# Patient Record
Sex: Male | Born: 1985 | Race: Black or African American | Hispanic: No | Marital: Married
Health system: Southern US, Community
[De-identification: ages and names within clinical notes are randomized; demographics above are authoritative.]

---

## 2014-01-03 ENCOUNTER — Ambulatory Visit: Payer: Self-pay | Admitting: Physician Assistant

## 2014-05-13 ENCOUNTER — Ambulatory Visit: Payer: Self-pay | Admitting: Family Medicine

## 2014-05-13 LAB — URINALYSIS, COMPLETE
Bilirubin,UR: NEGATIVE
Blood: NEGATIVE
Glucose,UR: NEGATIVE
Ketone: NEGATIVE
Leukocyte Esterase: NEGATIVE
Nitrite: NEGATIVE
Ph: 7.5 (ref 5.0–8.0)
Specific Gravity: 1.02 (ref 1.000–1.030)

## 2014-05-13 LAB — CBC WITH DIFFERENTIAL/PLATELET
Basophil #: 0 10*3/uL (ref 0.0–0.1)
Basophil %: 0.1 %
Eosinophil #: 0 10*3/uL (ref 0.0–0.7)
Eosinophil %: 0.1 %
HCT: 51.4 % (ref 40.0–52.0)
HGB: 17 g/dL (ref 13.0–18.0)
LYMPHS ABS: 0.4 10*3/uL — AB (ref 1.0–3.6)
Lymphocyte %: 2.2 %
MCH: 29.5 pg (ref 26.0–34.0)
MCHC: 33 g/dL (ref 32.0–36.0)
MCV: 90 fL (ref 80–100)
MONO ABS: 1.1 x10 3/mm — AB (ref 0.2–1.0)
Monocyte %: 6.9 %
NEUTROS ABS: 14.7 10*3/uL — AB (ref 1.4–6.5)
NEUTROS PCT: 90.7 %
Platelet: 212 10*3/uL (ref 150–440)
RBC: 5.74 10*6/uL (ref 4.40–5.90)
RDW: 12.8 % (ref 11.5–14.5)
WBC: 16.3 10*3/uL — AB (ref 3.8–10.6)

## 2014-05-14 ENCOUNTER — Ambulatory Visit: Payer: Self-pay

## 2014-05-14 LAB — CBC WITH DIFFERENTIAL/PLATELET
BASOS ABS: 0 10*3/uL (ref 0.0–0.1)
Basophil %: 0.5 %
Eosinophil #: 0 10*3/uL (ref 0.0–0.7)
Eosinophil %: 0.2 %
HCT: 48.9 % (ref 40.0–52.0)
HGB: 16.3 g/dL (ref 13.0–18.0)
LYMPHS PCT: 14 %
Lymphocyte #: 1.3 10*3/uL (ref 1.0–3.6)
MCH: 29.6 pg (ref 26.0–34.0)
MCHC: 33.3 g/dL (ref 32.0–36.0)
MCV: 89 fL (ref 80–100)
Monocyte #: 1.1 x10 3/mm — ABNORMAL HIGH (ref 0.2–1.0)
Monocyte %: 12.2 %
Neutrophil #: 6.7 10*3/uL — ABNORMAL HIGH (ref 1.4–6.5)
Neutrophil %: 73.1 %
PLATELETS: 199 10*3/uL (ref 150–440)
RBC: 5.5 10*6/uL (ref 4.40–5.90)
RDW: 13 % (ref 11.5–14.5)
WBC: 9.2 10*3/uL (ref 3.8–10.6)

## 2014-05-14 LAB — COMPREHENSIVE METABOLIC PANEL
ALK PHOS: 59 U/L
ANION GAP: 7 (ref 7–16)
Albumin: 4.2 g/dL (ref 3.4–5.0)
BUN: 9 mg/dL (ref 7–18)
Bilirubin,Total: 0.6 mg/dL (ref 0.2–1.0)
CREATININE: 1.09 mg/dL (ref 0.60–1.30)
Calcium, Total: 9 mg/dL (ref 8.5–10.1)
Chloride: 98 mmol/L (ref 98–107)
Co2: 32 mmol/L (ref 21–32)
EGFR (African American): >60
EGFR (Non-African Amer.): >60
GLUCOSE: 83 mg/dL (ref 65–99)
Osmolality: 272 (ref 275–301)
Potassium: 3.5 mmol/L (ref 3.5–5.1)
SGOT(AST): 20 U/L (ref 15–37)
SGPT (ALT): 53 U/L
SODIUM: 137 mmol/L (ref 136–145)
Total Protein: 8.2 g/dL (ref 6.4–8.2)

## 2014-05-14 LAB — CK-MB: CK-MB: <0.5 ng/mL — ABNORMAL LOW (ref 0.5–3.6)

## 2014-05-14 LAB — CK: CK, Total: 116 U/L (ref 39–308)

## 2014-08-15 ENCOUNTER — Ambulatory Visit: Payer: Self-pay | Admitting: Physician Assistant

## 2015-07-10 IMAGING — CR DG CHEST 2V
2 series · 2 of 2 positions shown · non-contrast
Comparison: None.

CLINICAL DATA: One day history of chest pain.  History of asthma

EXAM:
CHEST  2 VIEW

[chest pa]
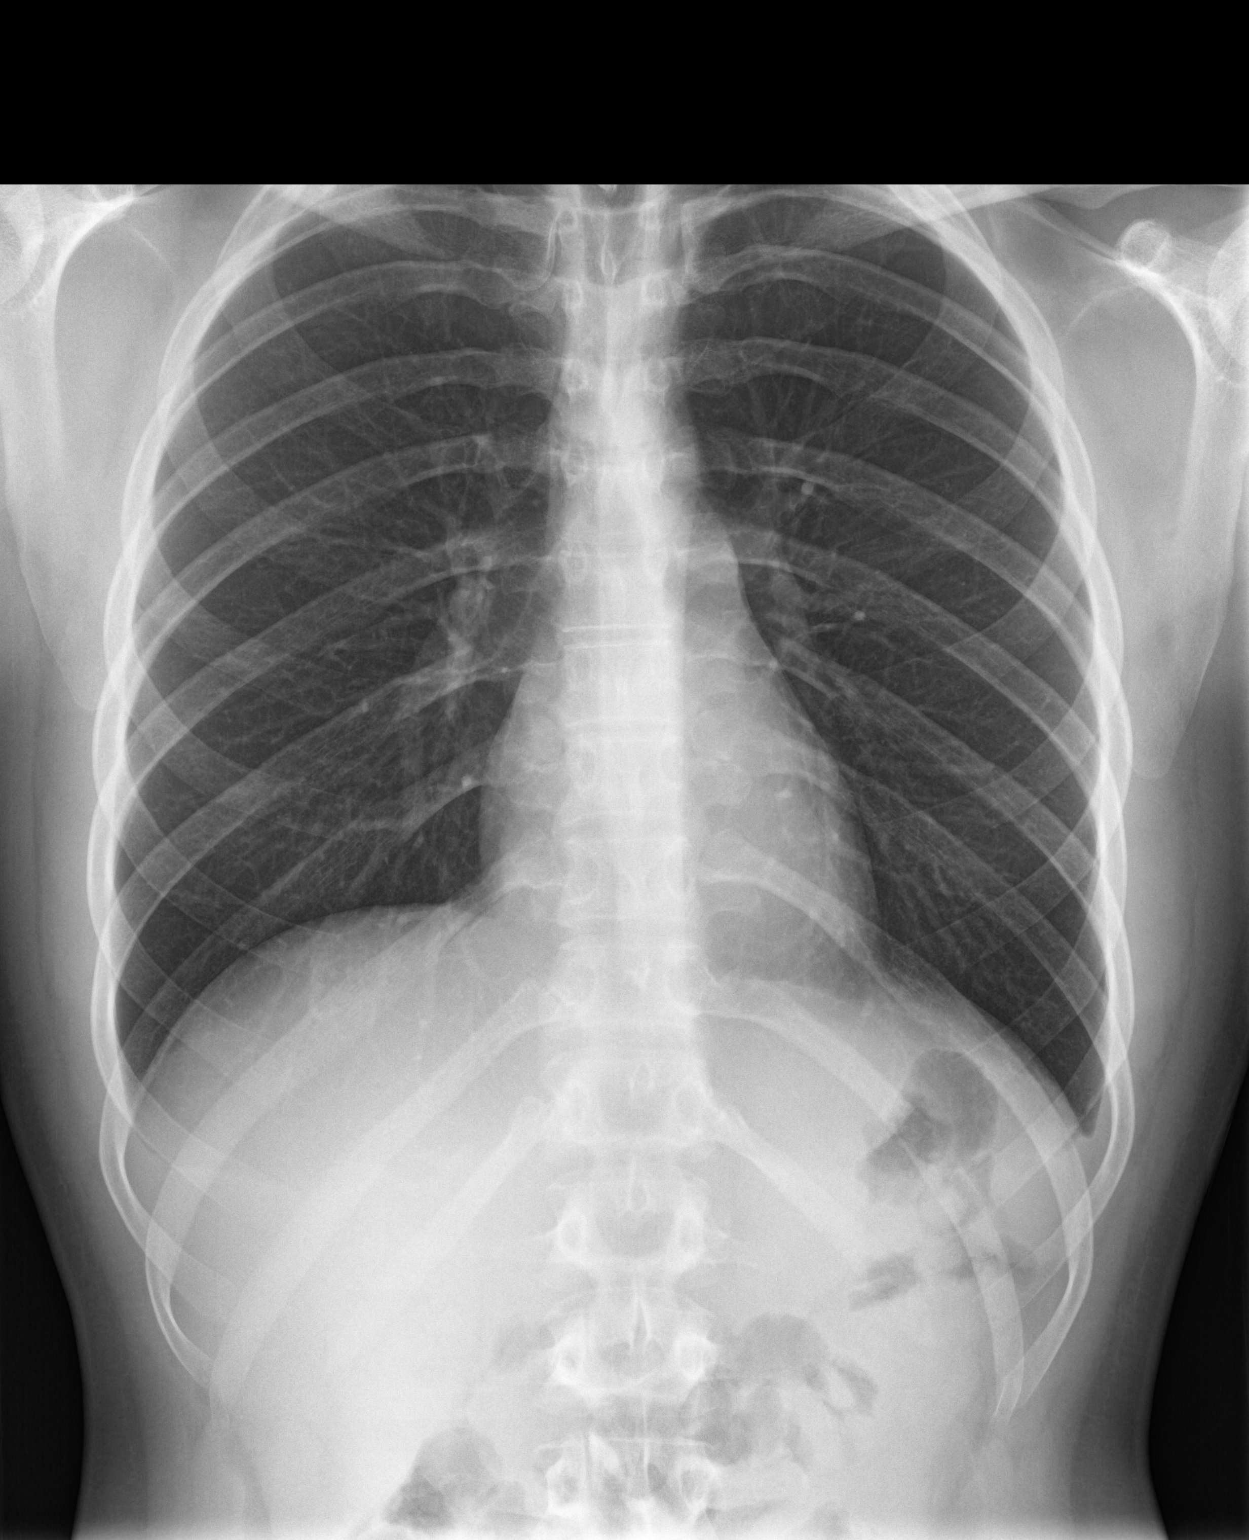

[chest lat]
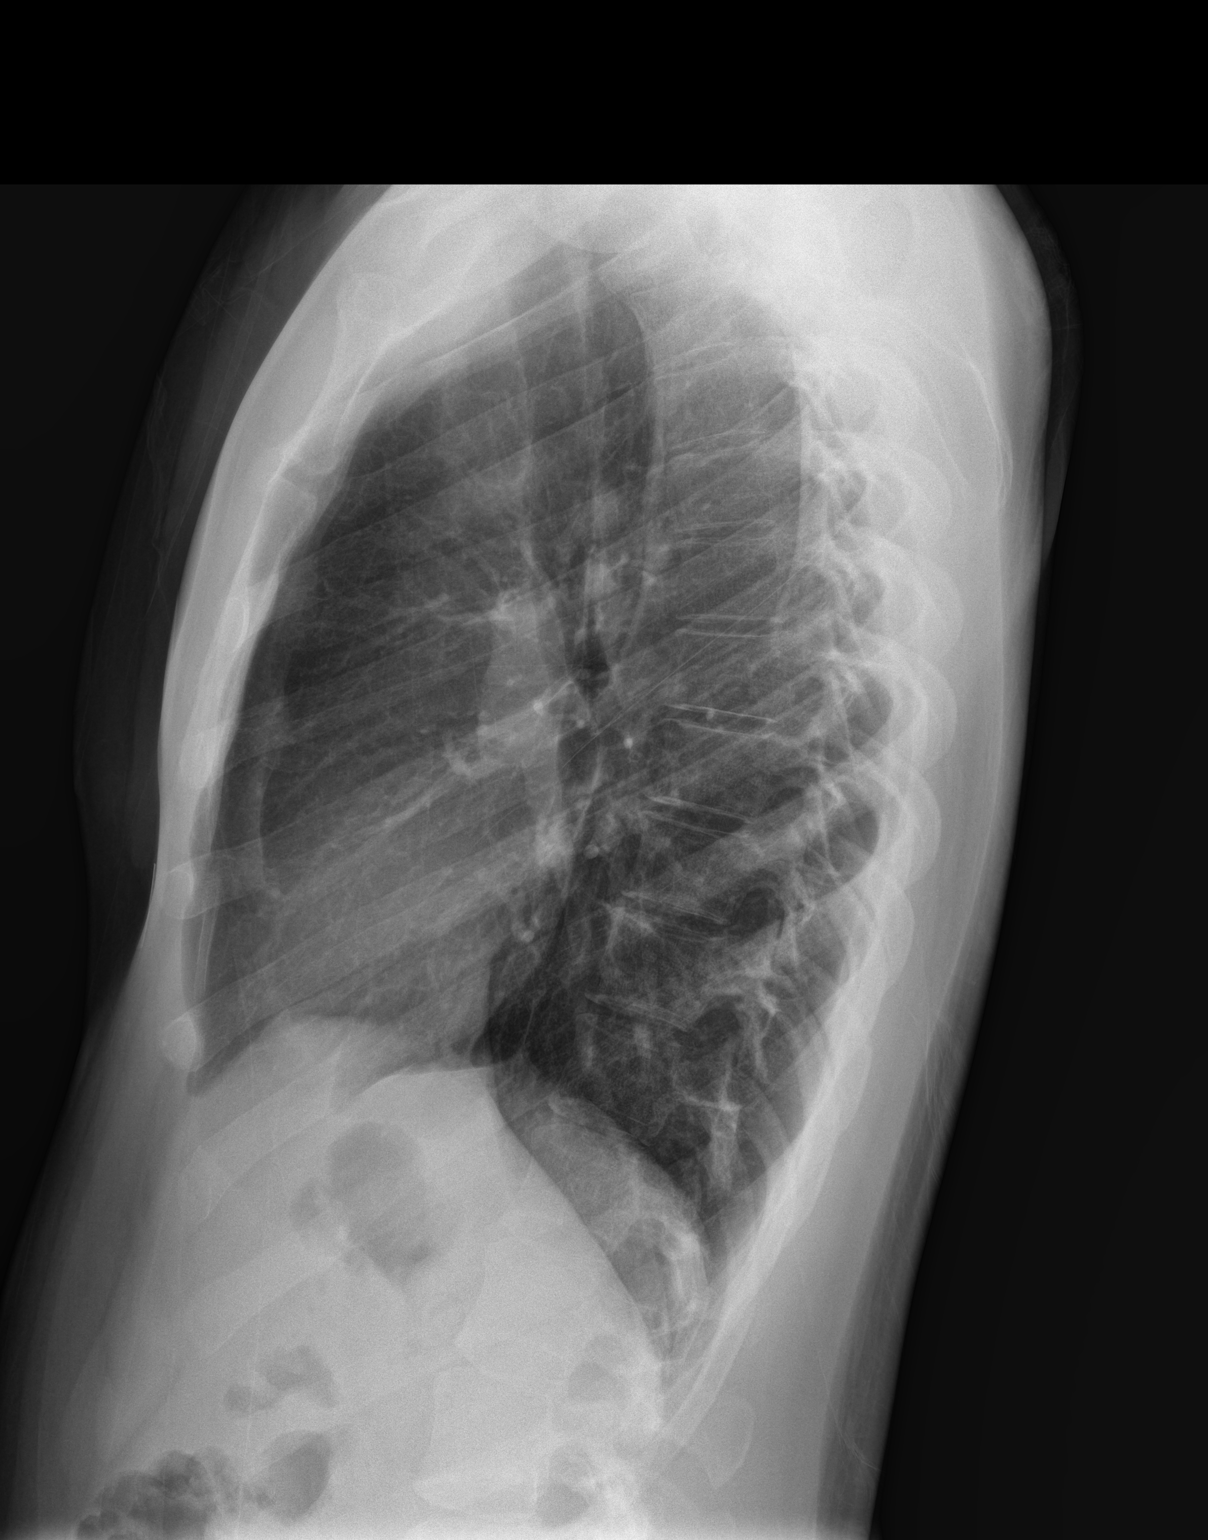

[2 of 2 positions shown; findings below may reference images not displayed]

FINDINGS: Lungs are clear. Heart size and pulmonary vascularity are normal. No
adenopathy. No bone lesions. No pneumothorax.
IMPRESSION: No abnormality noted.

## 2018-06-11 ENCOUNTER — Emergency Department (HOSPITAL_COMMUNITY)
Admission: EM | Admit: 2018-06-11 | Discharge: 2018-06-11 | Disposition: A | Discharge status: Home / Self Care | Payer: Self-pay | Attending: Emergency Medicine | Admitting: Emergency Medicine

## 2018-06-11 DIAGNOSIS — S025XXA Fracture of tooth (traumatic), initial encounter for closed fracture: Secondary | ICD-10-CM | POA: Insufficient documentation

## 2018-06-11 DIAGNOSIS — Y999 Unspecified external cause status: Secondary | ICD-10-CM | POA: Insufficient documentation

## 2018-06-11 DIAGNOSIS — K0889 Other specified disorders of teeth and supporting structures: Secondary | ICD-10-CM | POA: Insufficient documentation

## 2018-06-11 DIAGNOSIS — Y939 Activity, unspecified: Secondary | ICD-10-CM | POA: Insufficient documentation

## 2018-06-11 DIAGNOSIS — Y929 Unspecified place or not applicable: Secondary | ICD-10-CM | POA: Insufficient documentation

## 2018-06-11 DIAGNOSIS — X58XXXA Exposure to other specified factors, initial encounter: Secondary | ICD-10-CM | POA: Insufficient documentation

## 2018-06-11 MED ORDER — AMOXICILLIN 500 MG PO CAPS: 500.0000 mg | ORAL_CAPSULE | Freq: Two times a day (BID) | ORAL | 0 refills | Status: AC

## 2018-06-11 MED ORDER — NAPROXEN 500 MG PO TABS: 500.0000 mg | ORAL_TABLET | Freq: Two times a day (BID) | ORAL | 0 refills | Status: AC

## 2018-06-11 NOTE — ED Triage Notes (Signed)
Per pt, states back, lower right tooth pain on and off for days-states unable to see dentist till next week

## 2018-06-11 NOTE — ED Provider Notes (Signed)
[facility]-EMERGENCY DEPT Provider Note   CSN: [id number] Arrival date & time: 06/11/18  0704   History   Chief Complaint Chief Complaint  Patient presents with  . Dental Pain    HPI Devin Lutz is a 33 y.o. male with no significant past medical history who presents for evaluation of dental pain.  States he has had pain x2 days.  Patient states he fractured tooth #28, 1 week ago and has had increasing pain to this area.  Rates his pain an 8/10.  Pain does not radiate.  Patient states pain is worse when he drinks hot or cold liquids.  Patient states he called his dentist was not able to get in until a week from today.  Has been taking Tylenol intermittently for his pain.  Last Tylenol 24 hours PTA.  Describes his pain as aching.  Denies additional aggravating or alleviating factors.  Pain is constant in nature.  Denies previous history of dental abscesses or dental infections.  Able to tolerate p.o. intake without difficulty.  Denies fever, chills, nausea, vomiting, neck pain, neck stiffness, chest pain, shortness of breath.  Patient states he feels like the right side of his face is swollen.  History obtained from patient.  No interpreter was used.  HPI  No past medical history on file.  There are no active problems to display for this patient.      Home Medications    Prior to Admission medications   Medication Sig Start Date End Date Taking? Authorizing Provider  amoxicillin (AMOXIL) 500 MG capsule Take 1 capsule (500 mg total) by mouth 2 (two) times daily for 5 days. 06/11/18 06/16/18  Montasia Chisenhall A, PA-C  naproxen (NAPROSYN) 500 MG tablet Take 1 tablet (500 mg total) by mouth 2 (two) times daily. 06/11/18   Marcena Dias A, PA-C    Family History No family history on file.  Social History Social History   Tobacco Use  . Smoking status: Not on file  Substance Use Topics  . Alcohol use: Not on file  . Drug use: Not on file      Allergies   Patient has no allergy information on record.   Review of Systems Review of Systems  Constitutional: Negative.   HENT: Positive for dental problem and facial swelling. Negative for congestion, drooling, ear discharge, ear pain, mouth sores, nosebleeds, postnasal drip, rhinorrhea, sinus pressure, sinus pain, sneezing, sore throat, tinnitus, trouble swallowing and voice change.   Eyes: Negative.   Respiratory: Negative.   Cardiovascular: Negative.   Gastrointestinal: Negative.   Genitourinary: Negative.   Musculoskeletal: Negative.   Skin: Negative.   Neurological: Negative.   All other systems reviewed and are negative.    Physical Exam Updated Vital Signs BP (!) 142/86   Pulse 84   Temp 98 F (36.7 C) (Oral)   SpO2 100%   Physical Exam Vitals signs and nursing note reviewed.  Constitutional:      General: He is not in acute distress.    Appearance: He is well-developed. He is not ill-appearing, toxic-appearing or diaphoretic.  HENT:     Head: Normocephalic and atraumatic.     Jaw: There is normal jaw occlusion.     Right Ear: Tympanic membrane, ear canal and external ear normal. No drainage, swelling or tenderness. Tympanic membrane is not scarred, perforated, erythematous, retracted or bulging.     Left Ear: Tympanic membrane, ear canal and external ear normal. No drainage, swelling or tenderness. Tympanic membrane  is not scarred, perforated, erythematous, retracted or bulging.     Nose: Nose normal.     Right Sinus: No maxillary sinus tenderness or frontal sinus tenderness.     Left Sinus: No maxillary sinus tenderness or frontal sinus tenderness.     Comments: No facial tenderness.  No facial swelling.    Mouth/Throat:     Lips: Pink.     Mouth: Mucous membranes are moist. No injury, lacerations, oral lesions or angioedema.     Dentition: Abnormal dentition. Does not have dentures. Dental tenderness present. No gingival swelling, dental caries,  dental abscesses or gum lesions.     Tongue: No lesions.     Pharynx: Oropharynx is clear. Uvula midline.     Tonsils: No tonsillar exudate or tonsillar abscesses. Swelling: 0 on the right. 0 on the left.     Comments: Posterior oropharynx clear. Uvula midline without deviation. Tonsils without edema or exudate.  Mucous membranes moist.  Fractured tooth to tooth #28.  Mild surrounding gingival erythema.  No evidence of periapical abscess.  Palate without abnormal elevation.  No evidence of oral lesions.  No gingival swelling.  Tenderness to palpation over tooth #28.  No submandibular erythema or edema. Eyes:     Pupils: Pupils are equal, round, and reactive to light.  Neck:     Musculoskeletal: Normal range of motion and neck supple.  Cardiovascular:     Rate and Rhythm: Normal rate and regular rhythm.  Pulmonary:     Effort: Pulmonary effort is normal. No respiratory distress.  Abdominal:     General: There is no distension.     Palpations: Abdomen is soft.  Musculoskeletal: Normal range of motion.  Skin:    General: Skin is warm and dry.  Neurological:     Mental Status: He is alert.      ED Treatments / Results  Labs (all labs ordered are listed, but only abnormal results are displayed) Labs Reviewed - No data to display  EKG None  Radiology No results found.  Procedures Procedures (including critical care time)  Medications Ordered in ED Medications - No data to display   Initial Impression / Assessment and Plan / ED Course  I have reviewed the triage vital signs and the nursing notes.  Pertinent labs & imaging results that were available during my care of the patient were reviewed by me and considered in my medical decision making (see chart for details).  33 year old male who appears otherwise well presents for evaluation of dental pain.  Afebrile, nonseptic, non-ill-appearing.  Patient with fractured tooth over tooth #28.  Mild surrounding gingival erythema  without swelling.  No evidence of periapical abscess.  No oral lesions.  Able to tolerate p.o. intake in department thought difficulty.  No evidence of Ludwigs angina or deep space infection. Will treat with Abx and anti-inflammatory medicine.  Urged patient to follow-up with dentist.  Patient is hemodynamically stable and appropriate for DC home at this time.  Low suspicion for emergent pathology causing patient's symptoms.  I discussed return precautions.  Patient voiced understanding and is agreeable for follow-up.    Final Clinical Impressions(s) / ED Diagnoses   Final diagnoses:  Pain, dental    ED Discharge Orders         Ordered    amoxicillin (AMOXIL) 500 MG capsule  2 times daily     06/11/18 0809    naproxen (NAPROSYN) 500 MG tablet  2 times daily     06/11/18 0809  Opaline Reyburn A, PA-C 06/11/18 1007    Terrilee Files, MD 06/11/18 [phone]

## 2018-06-11 NOTE — ED Notes (Signed)
Bed: [facility] Expected date:  Expected time:  Means of arrival:  Comments: 

## 2018-06-11 NOTE — Discharge Instructions (Signed)
Evaluated today for dental pain.  I prescribed antibiotics as well as an anti-inflammatory.  Please take as prescribed.  Please follow-up with dentistry as soon as possible for evaluation.  Return to the ED for any worsening symptoms.
